# Patient Record
Sex: Female | Born: 1960 | Race: Black or African American | Hispanic: No | Smoking: Never smoker
Health system: Southern US, Community
[De-identification: ages and names within clinical notes are randomized; demographics above are authoritative.]

## PROBLEM LIST (undated history)

## (undated) DIAGNOSIS — B009 Herpesviral infection, unspecified: Secondary | ICD-10-CM

## (undated) HISTORY — DX: Herpesviral infection, unspecified: B00.9

---

## 1998-06-29 HISTORY — PX: ABDOMINAL HYSTERECTOMY: SHX81

## 2006-09-03 ENCOUNTER — Encounter: Admission: RE | Admit: 2006-09-03 | Discharge: 2006-09-03 | Payer: Self-pay | Admitting: Obstetrics and Gynecology

## 2008-03-30 ENCOUNTER — Encounter: Admission: RE | Admit: 2008-03-30 | Discharge: 2008-03-30 | Payer: Self-pay | Admitting: Obstetrics and Gynecology

## 2009-09-16 ENCOUNTER — Encounter: Admission: RE | Admit: 2009-09-16 | Discharge: 2009-09-16 | Payer: Self-pay | Admitting: Obstetrics and Gynecology

## 2009-09-23 ENCOUNTER — Encounter: Admission: RE | Admit: 2009-09-23 | Discharge: 2009-09-23 | Payer: Self-pay | Admitting: Obstetrics and Gynecology

## 2010-07-20 ENCOUNTER — Encounter: Payer: Self-pay | Admitting: Obstetrics and Gynecology

## 2010-09-04 ENCOUNTER — Other Ambulatory Visit: Payer: Self-pay | Admitting: Family Medicine

## 2010-09-04 DIAGNOSIS — R221 Localized swelling, mass and lump, neck: Secondary | ICD-10-CM

## 2010-09-05 ENCOUNTER — Ambulatory Visit
Admission: RE | Admit: 2010-09-05 | Discharge: 2010-09-05 | Disposition: A | Discharge status: Home / Self Care | Payer: BC Managed Care – PPO | Source: Ambulatory Visit | Attending: Family Medicine | Admitting: Family Medicine

## 2010-09-05 DIAGNOSIS — R221 Localized swelling, mass and lump, neck: Secondary | ICD-10-CM

## 2010-09-08 ENCOUNTER — Other Ambulatory Visit: Payer: Self-pay | Admitting: Obstetrics and Gynecology

## 2010-09-08 DIAGNOSIS — Z1231 Encounter for screening mammogram for malignant neoplasm of breast: Secondary | ICD-10-CM

## 2010-09-16 ENCOUNTER — Other Ambulatory Visit: Payer: Self-pay | Admitting: Otolaryngology

## 2010-09-16 ENCOUNTER — Other Ambulatory Visit (HOSPITAL_COMMUNITY)
Admission: RE | Admit: 2010-09-16 | Discharge: 2010-09-16 | Disposition: A | Discharge status: Home / Self Care | Payer: Self-pay | Source: Ambulatory Visit | Attending: Otolaryngology | Admitting: Otolaryngology

## 2010-09-16 DIAGNOSIS — E049 Nontoxic goiter, unspecified: Secondary | ICD-10-CM | POA: Insufficient documentation

## 2010-09-18 ENCOUNTER — Ambulatory Visit: Payer: Self-pay

## 2011-01-22 ENCOUNTER — Ambulatory Visit
Admission: RE | Admit: 2011-01-22 | Discharge: 2011-01-22 | Disposition: A | Discharge status: Home / Self Care | Payer: BC Managed Care – PPO | Source: Ambulatory Visit | Attending: Obstetrics and Gynecology | Admitting: Obstetrics and Gynecology

## 2011-01-22 DIAGNOSIS — Z1231 Encounter for screening mammogram for malignant neoplasm of breast: Secondary | ICD-10-CM

## 2011-09-23 ENCOUNTER — Ambulatory Visit (INDEPENDENT_AMBULATORY_CARE_PROVIDER_SITE_OTHER): Payer: BC Managed Care – PPO | Admitting: Obstetrics and Gynecology

## 2011-09-23 DIAGNOSIS — Z01419 Encounter for gynecological examination (general) (routine) without abnormal findings: Secondary | ICD-10-CM

## 2011-12-14 ENCOUNTER — Other Ambulatory Visit: Payer: Self-pay | Admitting: Obstetrics and Gynecology

## 2011-12-14 DIAGNOSIS — Z1231 Encounter for screening mammogram for malignant neoplasm of breast: Secondary | ICD-10-CM

## 2012-01-25 ENCOUNTER — Ambulatory Visit: Payer: Self-pay

## 2012-02-11 ENCOUNTER — Ambulatory Visit: Payer: Self-pay

## 2012-02-16 ENCOUNTER — Ambulatory Visit
Admission: RE | Admit: 2012-02-16 | Discharge: 2012-02-16 | Disposition: A | Discharge status: Home / Self Care | Payer: BC Managed Care – PPO | Source: Ambulatory Visit | Attending: Obstetrics and Gynecology | Admitting: Obstetrics and Gynecology

## 2012-02-16 DIAGNOSIS — Z1231 Encounter for screening mammogram for malignant neoplasm of breast: Secondary | ICD-10-CM

## 2012-02-17 ENCOUNTER — Encounter: Payer: Self-pay | Admitting: Obstetrics and Gynecology

## 2012-05-14 IMAGING — US US SOFT TISSUE HEAD/NECK
1 series · 14 of 25 positions shown · non-contrast
Comparison: None.

CLINICAL DATA: Right thyroid nodule on exam.

THYROID ULTRASOUND
TECHNIQUE: Ultrasound examination of the thyroid gland and adjacent
soft tissues was performed.

[Series 1: us soft tissue head/neck · 0.10mm/px · 14 of 67 slices shown]
[im 1/67]
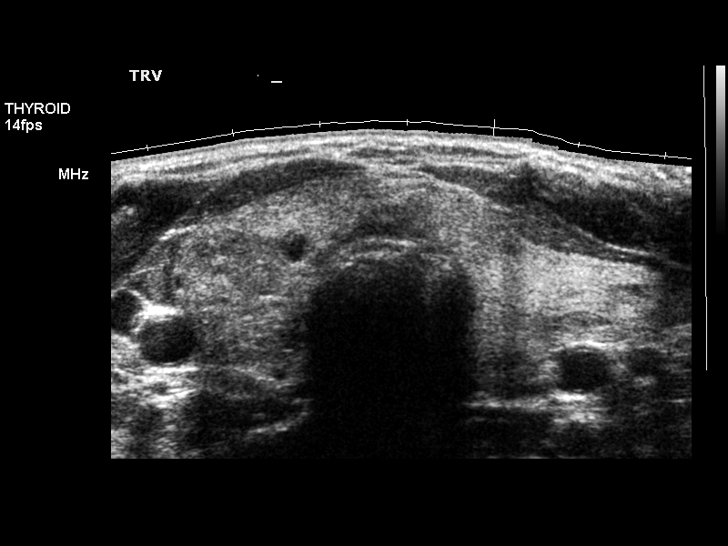
[im 6/67]
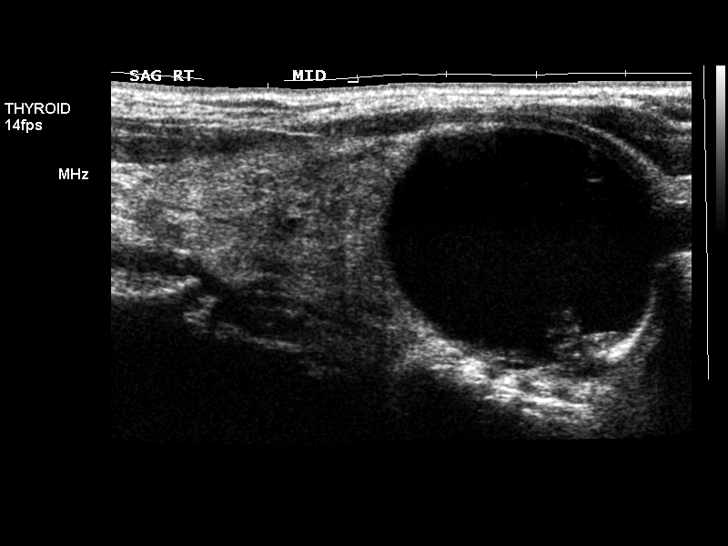
[im 12/67]
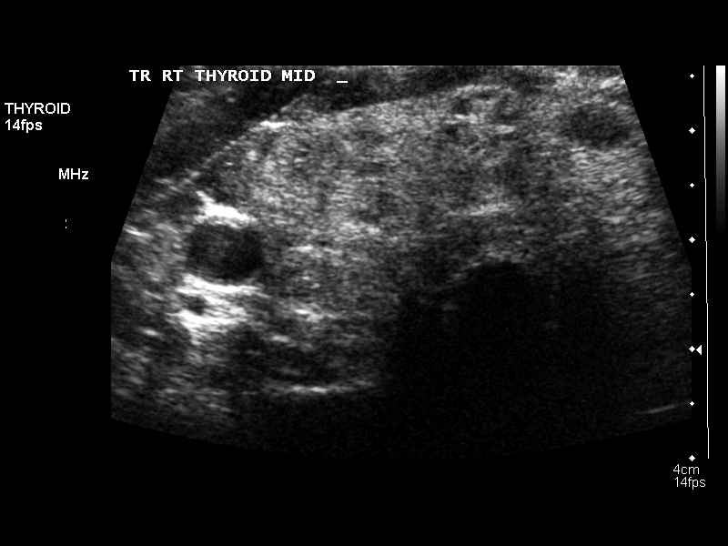
[im 17/67]
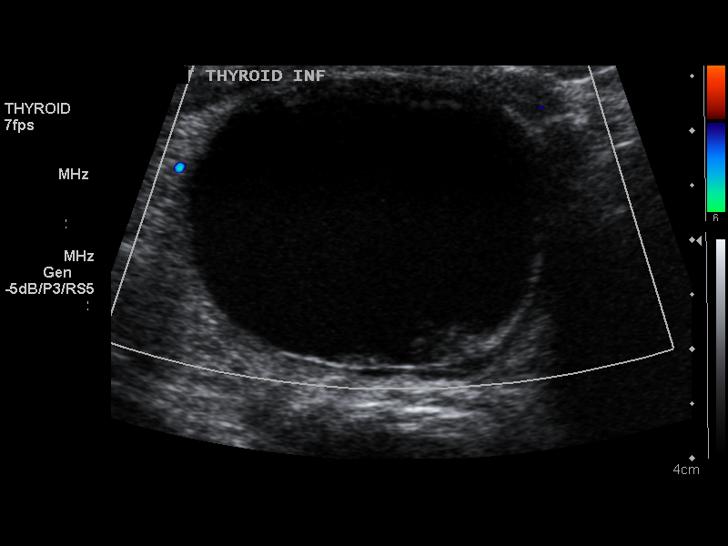
[im 23/67]
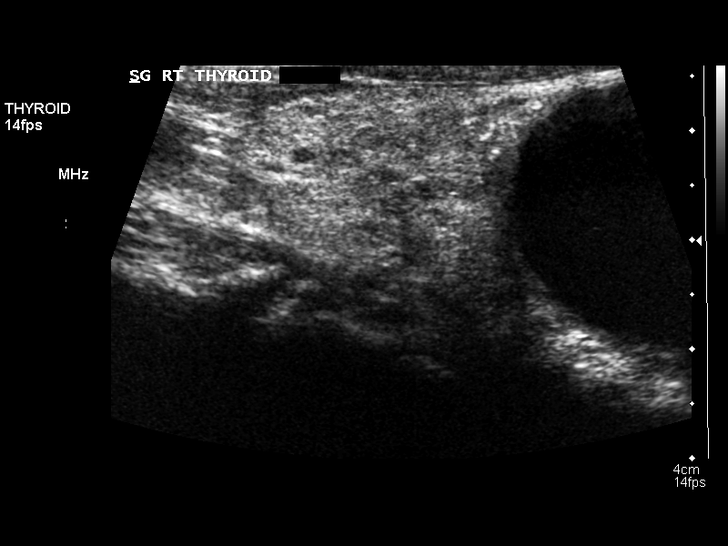
[im 25/67]
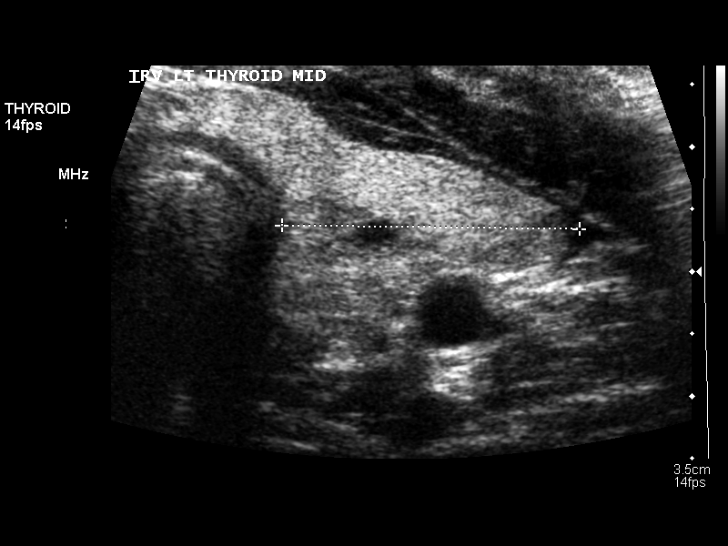
[im 31/67]
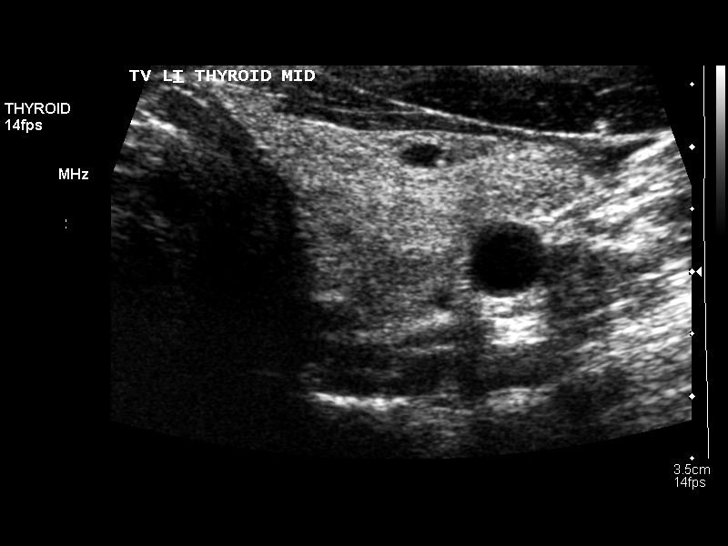
[im 36/67]
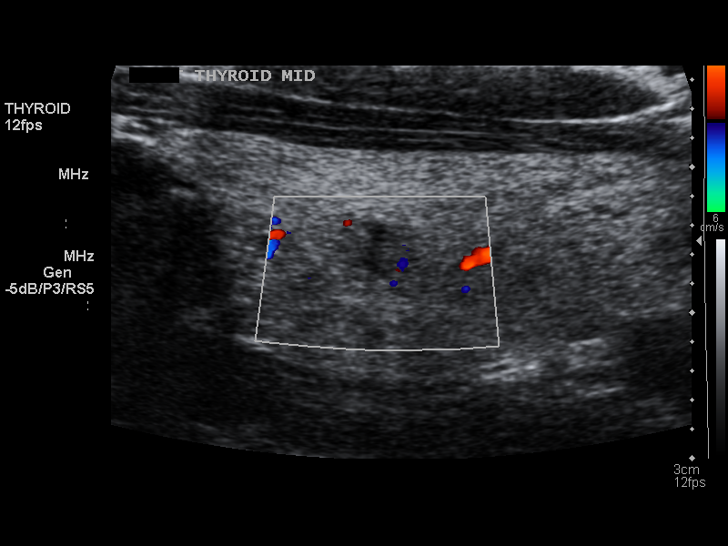
[im 42/67]
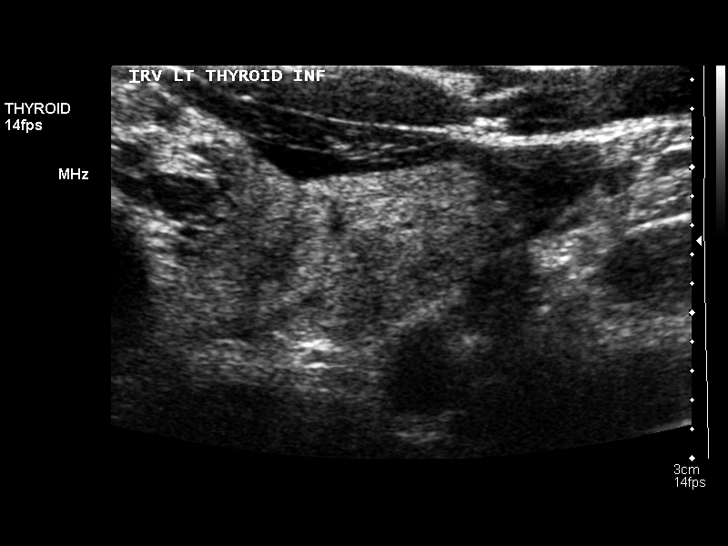
[im 45/67]
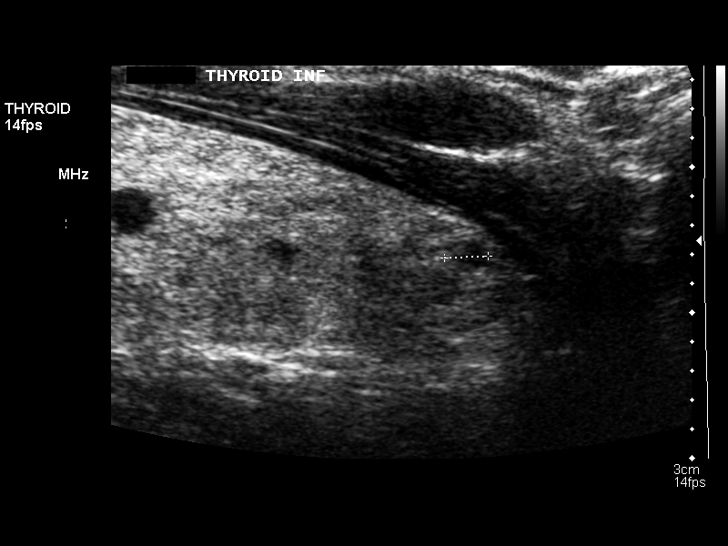
[im 50/67]
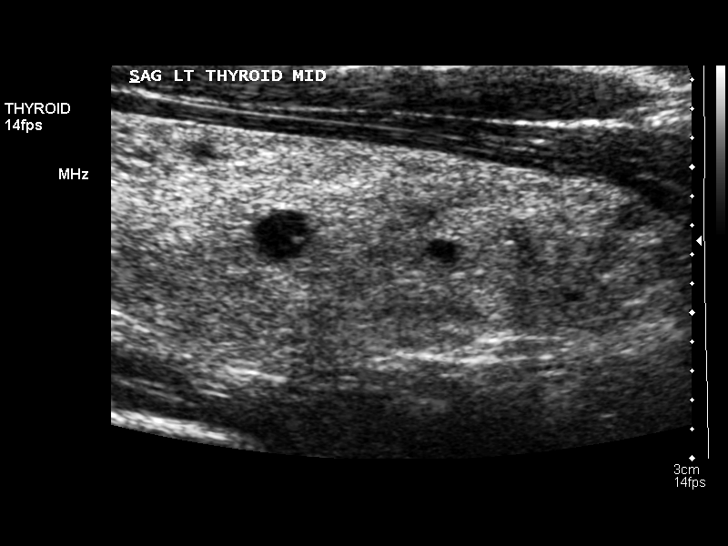
[im 56/67]
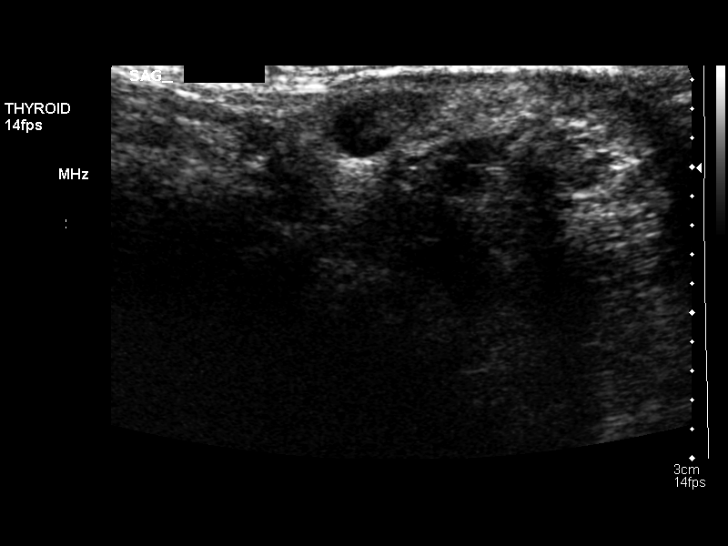
[im 61/67]
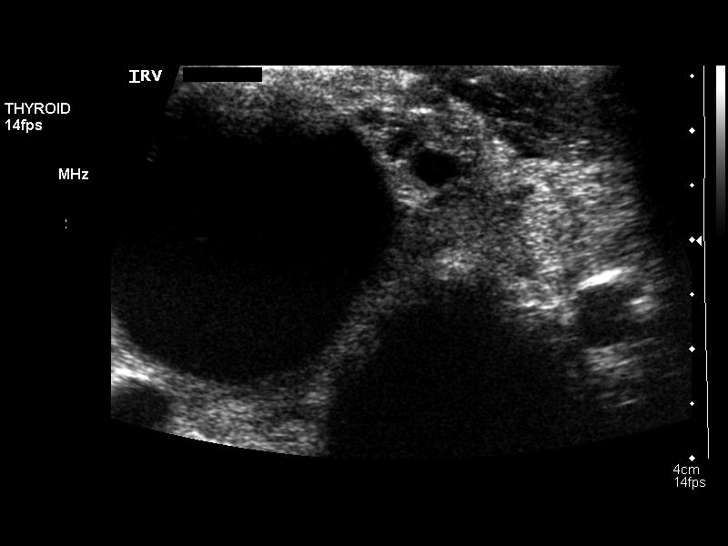
[im 67/67]
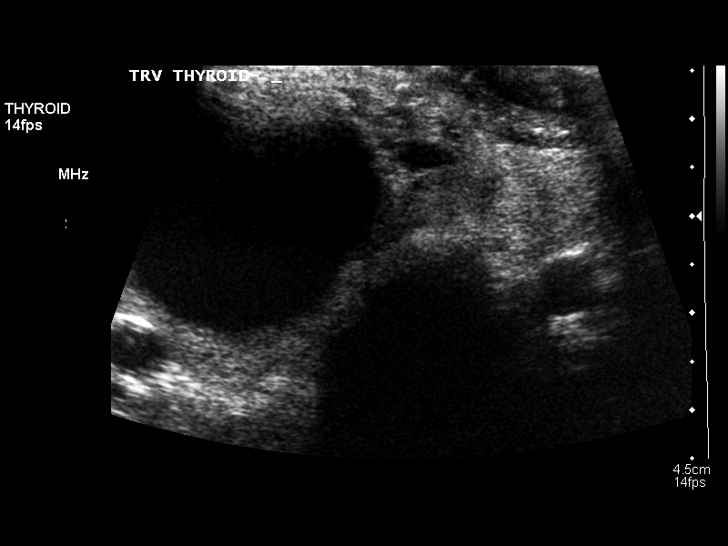

[14 of 25 positions shown; findings below may reference images not displayed]

FINDINGS: Right thyroid lobe:  6.3 x 2.8 x 1.8 cm
Left thyroid lobe:  5.3 x 1.9 x 2.4 cm
Isthmus:  9 mm in thickness.

Focal nodules:  Multiple small mixed cystic and solid nodules are
seen within both thyroid lobes, which measure up to 1.2 cm.

A dominant, nearly completely cystic nodule with several solid
mural nodules is seen in the inferior right thyroid lobe which
measures 3.7 cm.

Lymphadenopathy:  None visualized.
IMPRESSION: Multinodular goiter, with dominant 3.7 cm cystic and solid nodule
in the inferior right lobe.  Ultrasound guided fine needle
aspiration should be considered.

This recommendation follows the consensus statement:  Management of
Thyroid Nodules Detected as US:  Society of [employer]ologists in
800.  Available online at :
[URL]

## 2013-01-05 ENCOUNTER — Other Ambulatory Visit: Payer: Self-pay

## 2013-01-05 DIAGNOSIS — Z1231 Encounter for screening mammogram for malignant neoplasm of breast: Secondary | ICD-10-CM

## 2013-02-16 ENCOUNTER — Ambulatory Visit: Payer: Self-pay

## 2013-02-24 ENCOUNTER — Ambulatory Visit
Admission: RE | Admit: 2013-02-24 | Discharge: 2013-02-24 | Disposition: A | Discharge status: Home / Self Care | Payer: BC Managed Care – PPO | Source: Ambulatory Visit

## 2013-02-24 DIAGNOSIS — Z1231 Encounter for screening mammogram for malignant neoplasm of breast: Secondary | ICD-10-CM

## 2014-01-24 ENCOUNTER — Other Ambulatory Visit: Payer: Self-pay

## 2014-01-24 DIAGNOSIS — Z1231 Encounter for screening mammogram for malignant neoplasm of breast: Secondary | ICD-10-CM

## 2014-02-28 ENCOUNTER — Ambulatory Visit
Admission: RE | Admit: 2014-02-28 | Discharge: 2014-02-28 | Disposition: A | Discharge status: Home / Self Care | Payer: Managed Care, Other (non HMO) | Source: Ambulatory Visit

## 2014-02-28 DIAGNOSIS — Z1231 Encounter for screening mammogram for malignant neoplasm of breast: Secondary | ICD-10-CM

## 2014-12-17 ENCOUNTER — Other Ambulatory Visit: Payer: Self-pay

## 2014-12-17 DIAGNOSIS — Z1231 Encounter for screening mammogram for malignant neoplasm of breast: Secondary | ICD-10-CM

## 2015-01-22 IMAGING — MG MM DIGITAL SCREENING BILATERAL
4 series · 4 of 4 positions shown · non-contrast
Comparison: Previous exam(s).

CLINICAL DATA: Screening.

EXAM:
DIGITAL SCREENING BILATERAL MAMMOGRAM WITH CAD

[R MLO]
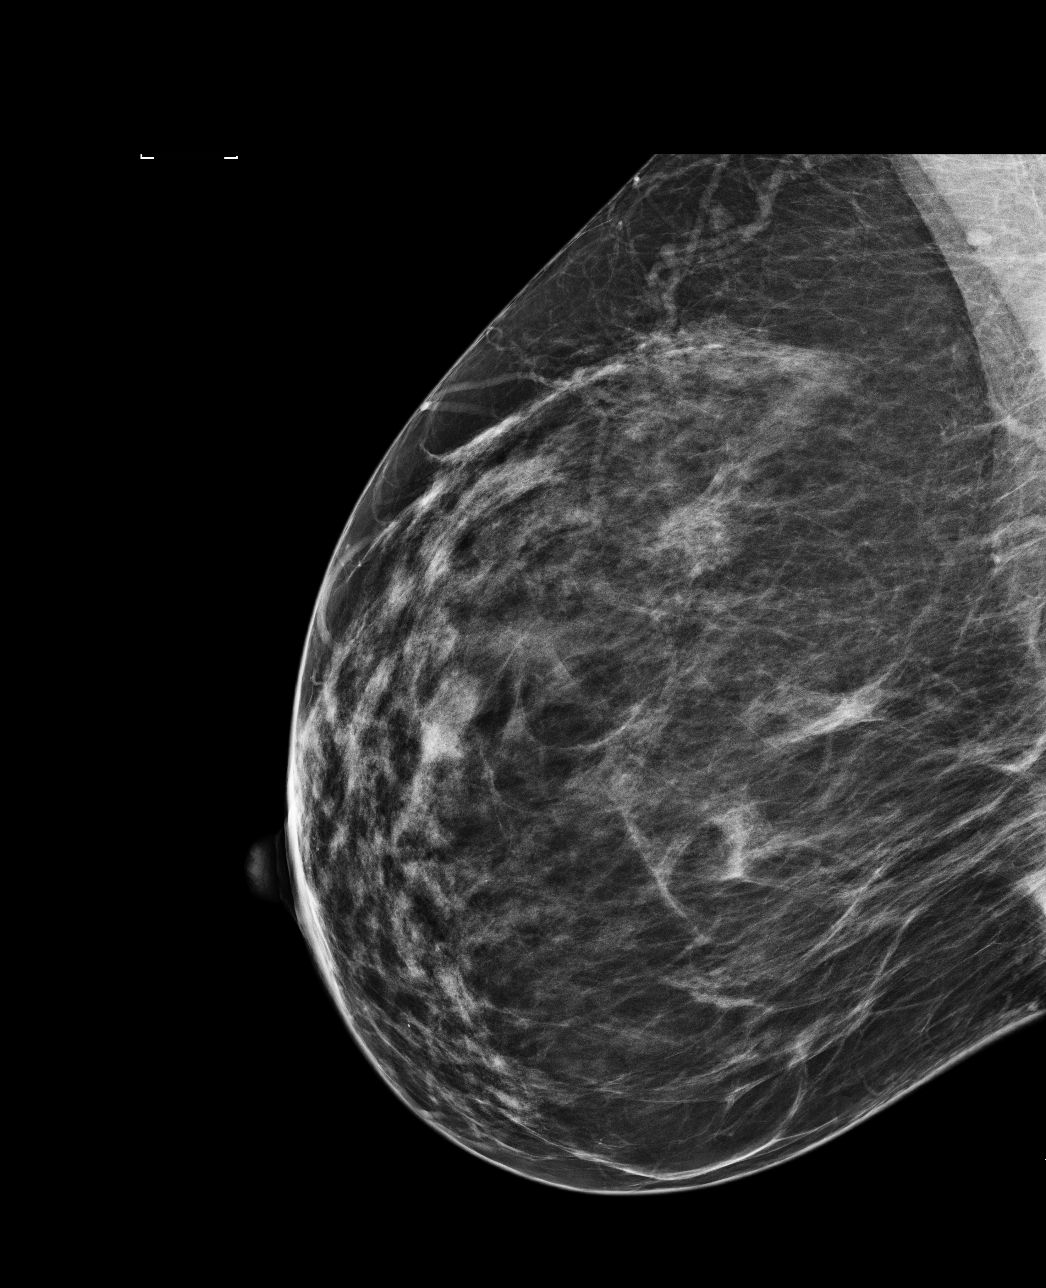

[L CC]
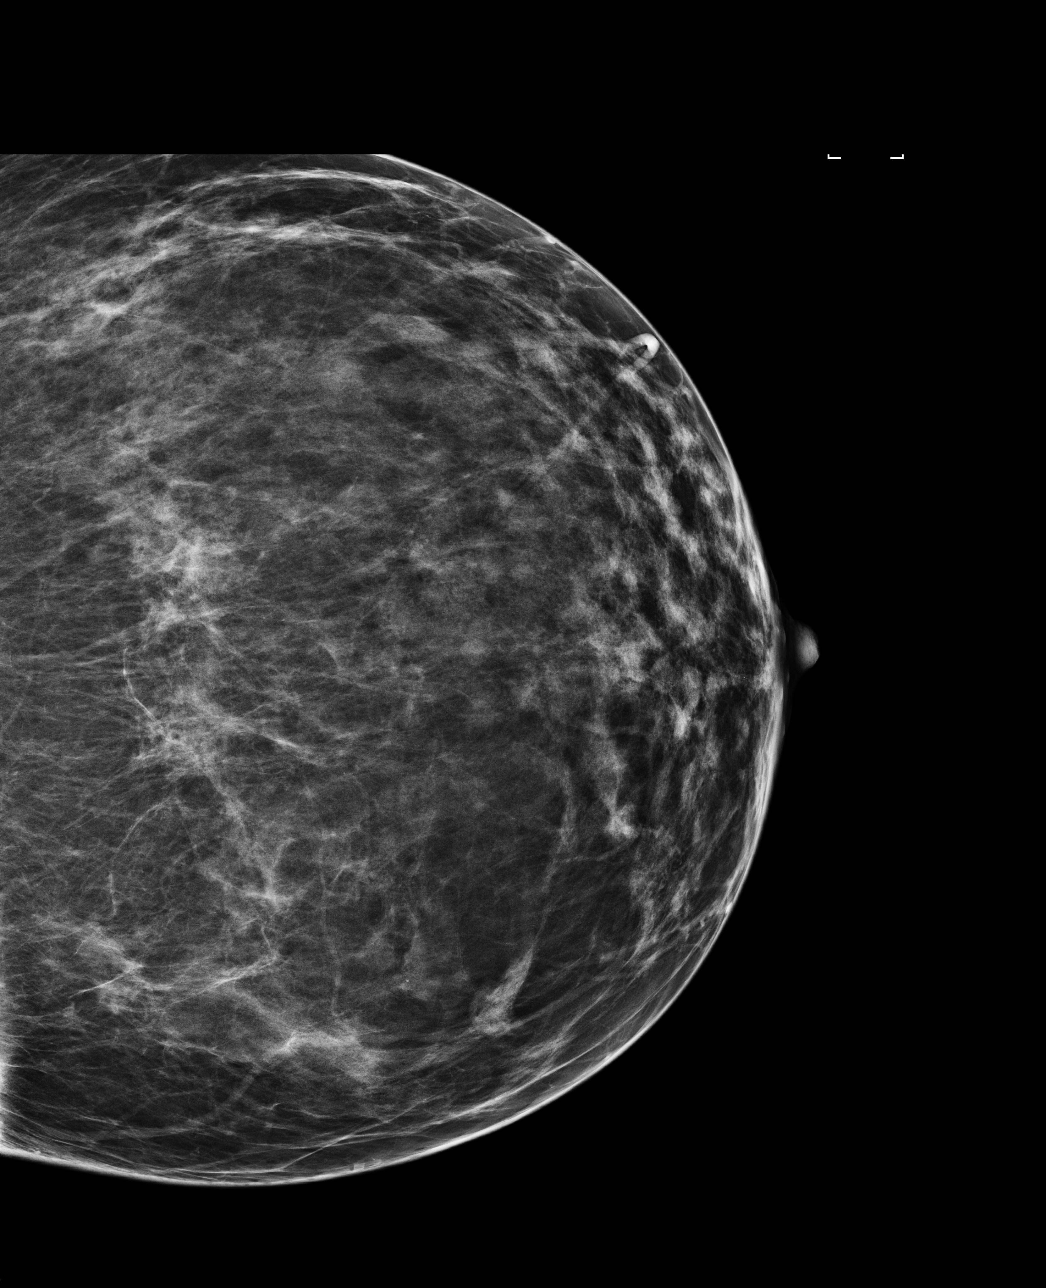

[L MLO]
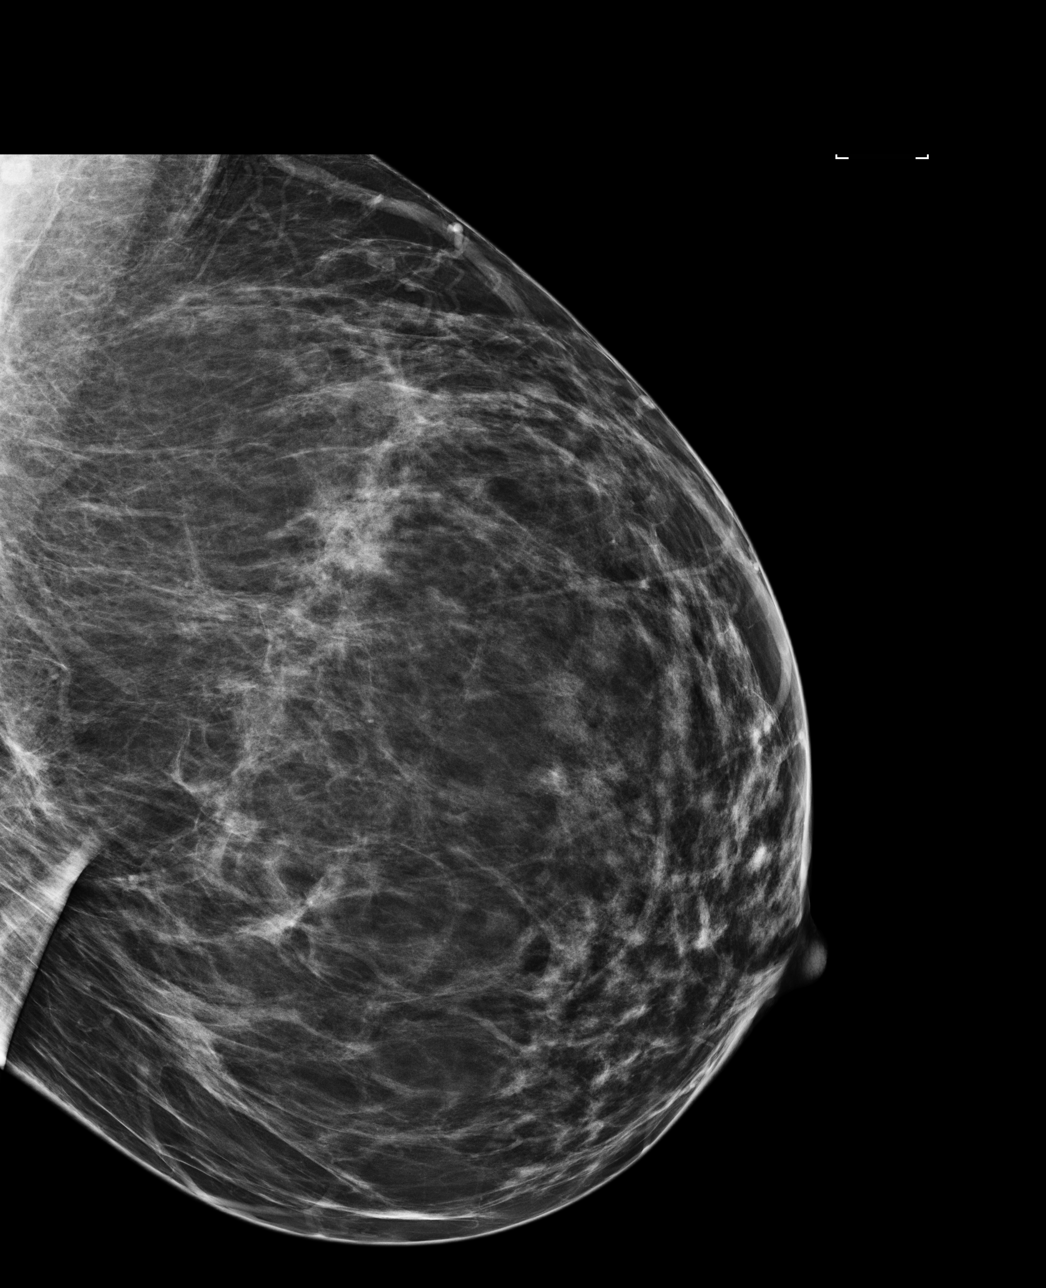

[R CC]
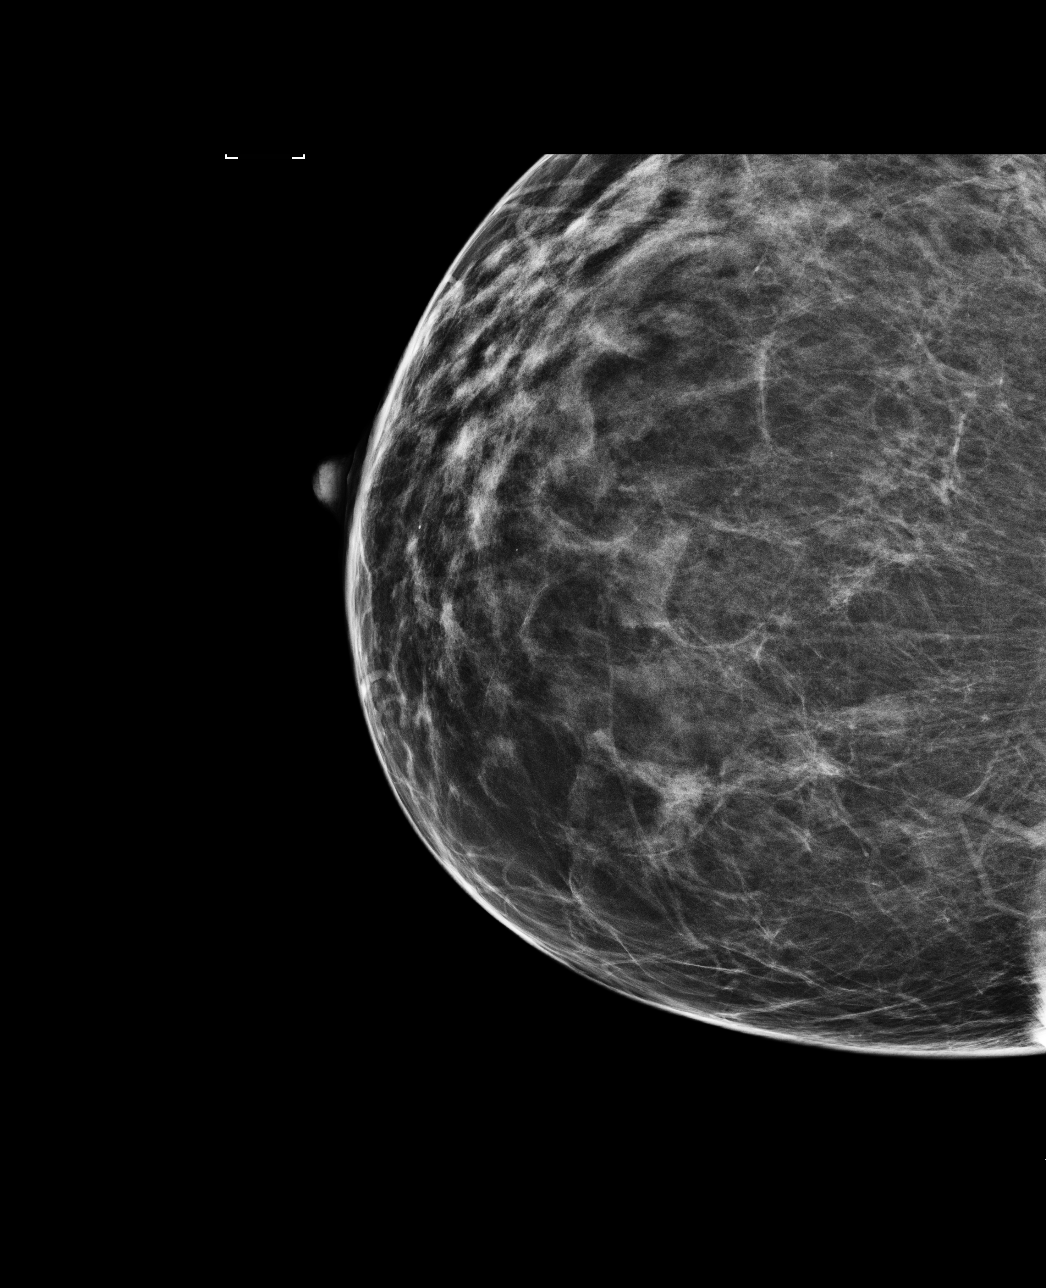

[4 of 4 positions shown; findings below may reference images not displayed]

ACR Breast Density Category c: The breast tissue is heterogeneously
dense, which may obscure small masses.
FINDINGS: There are no findings suspicious for malignancy. Images were
processed with CAD.
IMPRESSION: No mammographic evidence of malignancy. A result letter of this
screening mammogram will be mailed directly to the patient.

RECOMMENDATION:
Screening mammogram in one year. (Code:[id number])

BI-RADS CATEGORY  1: Negative.

## 2015-03-05 ENCOUNTER — Ambulatory Visit: Payer: Self-pay

## 2017-01-11 DIAGNOSIS — Z01419 Encounter for gynecological examination (general) (routine) without abnormal findings: Secondary | ICD-10-CM | POA: Diagnosis not present

## 2017-01-11 DIAGNOSIS — Z1231 Encounter for screening mammogram for malignant neoplasm of breast: Secondary | ICD-10-CM | POA: Diagnosis not present

## 2017-03-29 DIAGNOSIS — E559 Vitamin D deficiency, unspecified: Secondary | ICD-10-CM | POA: Diagnosis not present

## 2017-03-29 DIAGNOSIS — Z131 Encounter for screening for diabetes mellitus: Secondary | ICD-10-CM | POA: Diagnosis not present

## 2017-03-29 DIAGNOSIS — Z1329 Encounter for screening for other suspected endocrine disorder: Secondary | ICD-10-CM | POA: Diagnosis not present

## 2017-03-29 DIAGNOSIS — Z0000 Encounter for general adult medical examination without abnormal findings: Secondary | ICD-10-CM | POA: Diagnosis not present

## 2017-03-29 DIAGNOSIS — Z136 Encounter for screening for cardiovascular disorders: Secondary | ICD-10-CM | POA: Diagnosis not present

## 2017-04-05 ENCOUNTER — Encounter: Payer: 59 | Attending: Internal Medicine | Admitting: Registered"

## 2017-04-05 ENCOUNTER — Encounter: Payer: Self-pay | Admitting: Registered"

## 2017-04-05 DIAGNOSIS — E669 Obesity, unspecified: Secondary | ICD-10-CM | POA: Insufficient documentation

## 2017-04-05 DIAGNOSIS — E785 Hyperlipidemia, unspecified: Secondary | ICD-10-CM | POA: Diagnosis not present

## 2017-04-05 DIAGNOSIS — R635 Abnormal weight gain: Secondary | ICD-10-CM | POA: Insufficient documentation

## 2017-04-05 DIAGNOSIS — Z713 Dietary counseling and surveillance: Secondary | ICD-10-CM | POA: Diagnosis not present

## 2017-04-05 NOTE — Progress Notes (Signed)
Medical Nutrition Therapy:  Appt start time: 0800 end time:  0840.  Assessment:  Primary concerns today: Pt states she has gained over 30 lbs in the last 18 months even though she eats 1,000 cal or less and exercises 3-5x week. Pt sates she is not hungry most of the time. Pt states she has swelling in her hands and feet. Pt states she does not use salt at home just because she does not enjoy salty food. Pt states she has been eating out more since her daughter left for college Aug 2017 ([place] and [place]).   Pt states she was having frequent surgeries for fibroids and had partial hysterectomy in 2000. Per referral TSH is in normal range (1.92 UIU/mL)  Pt states she is taking potassium OTC occasionally for cramps.  Preferred Learning Style:   No preference indicated   Learning Readiness:   Contemplating  MEDICATIONS: reviewed (just started 30-day phentermine for metabolism boost)   DIETARY INTAKE:  Usual eating pattern includes 2-3 meals and 0-2 snacks per day.  Everyday foods include salad.  Avoided foods include (doesn't enjoy beans).    24-hr recall:  B ( AM): chicken breakfast bowl [employer] Snk ( AM): bananas (for potassium), berries, grapes  L ( PM): Salad, cob salad OR Zaxbys salad Snk ( PM): none or fruit D ( PM): none if big salad at lunch OR steel head trout, veggie, sweet potato  Snk ( PM): avocado Beverages: water  Usual physical activity: 3-5x week cardio  Estimated energy needs: 1800 calories 200 g carbohydrates 113 g protein 60 g fat  Nutritional Diagnosis:  NB-1.1 Food and nutrition-related knowledge deficit As related to sources of sodium.  As evidenced by stated low intake of sodium not consistent with dietary recall (fast food) .    Intervention:  Nutrition Education. Discussed need for adequate caloric intake to prevent reduced metabolism. Discussed role of sodium in water weight gain and food sources. Discussed need for iodine in diet when  avoiding sodium. Discussed food sources of potassium. Discussed resistance training for building muscle and increasing metabolism.  Teaching Method Utilized:  Visual Auditory  Handouts given during visit include: none  Barriers to learning/adherence to lifestyle change: resistance to increasing food intake due to fear of gaining weight  Demonstrated degree of understanding via:  Teach Back   Monitoring/Evaluation:  Dietary intake, exercise, and body weight prn.

## 2017-04-05 NOTE — Patient Instructions (Signed)
   Look for food rich iodine since you do not eat much salt  Continue taking vitamin D  Aim to eat more foods from home to get less sodium in your diet  Avoid skipping meals  If you are still experiencing weight gain after eating more calories and less salt, you may want to investigate hormone levels  Potassium food sources: tomatoes, potatoes, swiss chard & spinach

## 2017-07-01 DIAGNOSIS — M6740 Ganglion, unspecified site: Secondary | ICD-10-CM | POA: Diagnosis not present

## 2017-08-17 DIAGNOSIS — Z23 Encounter for immunization: Secondary | ICD-10-CM | POA: Diagnosis not present

## 2018-01-06 DIAGNOSIS — K219 Gastro-esophageal reflux disease without esophagitis: Secondary | ICD-10-CM | POA: Diagnosis not present

## 2018-01-06 DIAGNOSIS — J3501 Chronic tonsillitis: Secondary | ICD-10-CM | POA: Diagnosis not present

## 2018-01-06 DIAGNOSIS — R196 Halitosis: Secondary | ICD-10-CM | POA: Diagnosis not present

## 2018-01-19 DIAGNOSIS — Z01419 Encounter for gynecological examination (general) (routine) without abnormal findings: Secondary | ICD-10-CM | POA: Diagnosis not present

## 2018-01-19 DIAGNOSIS — Z6831 Body mass index (BMI) 31.0-31.9, adult: Secondary | ICD-10-CM | POA: Diagnosis not present

## 2018-01-19 DIAGNOSIS — Z1231 Encounter for screening mammogram for malignant neoplasm of breast: Secondary | ICD-10-CM | POA: Diagnosis not present

## 2018-03-08 DIAGNOSIS — L649 Androgenic alopecia, unspecified: Secondary | ICD-10-CM | POA: Diagnosis not present

## 2018-03-08 DIAGNOSIS — L669 Cicatricial alopecia, unspecified: Secondary | ICD-10-CM | POA: Diagnosis not present

## 2018-03-08 DIAGNOSIS — L659 Nonscarring hair loss, unspecified: Secondary | ICD-10-CM | POA: Diagnosis not present

## 2018-04-25 DIAGNOSIS — J321 Chronic frontal sinusitis: Secondary | ICD-10-CM | POA: Diagnosis not present

## 2018-04-25 DIAGNOSIS — H6092 Unspecified otitis externa, left ear: Secondary | ICD-10-CM | POA: Diagnosis not present

## 2018-04-28 DIAGNOSIS — R072 Precordial pain: Secondary | ICD-10-CM | POA: Diagnosis not present

## 2018-04-28 DIAGNOSIS — I499 Cardiac arrhythmia, unspecified: Secondary | ICD-10-CM | POA: Diagnosis not present

## 2018-04-28 DIAGNOSIS — R079 Chest pain, unspecified: Secondary | ICD-10-CM | POA: Diagnosis not present

## 2018-04-29 DIAGNOSIS — R05 Cough: Secondary | ICD-10-CM | POA: Diagnosis not present

## 2018-04-29 DIAGNOSIS — R0789 Other chest pain: Secondary | ICD-10-CM | POA: Diagnosis not present

## 2018-05-11 DIAGNOSIS — R0789 Other chest pain: Secondary | ICD-10-CM | POA: Diagnosis not present

## 2018-05-11 DIAGNOSIS — Z0189 Encounter for other specified special examinations: Secondary | ICD-10-CM | POA: Diagnosis not present

## 2018-06-15 DIAGNOSIS — R196 Halitosis: Secondary | ICD-10-CM | POA: Diagnosis not present

## 2018-06-20 DIAGNOSIS — G44209 Tension-type headache, unspecified, not intractable: Secondary | ICD-10-CM | POA: Diagnosis not present

## 2018-06-20 DIAGNOSIS — R05 Cough: Secondary | ICD-10-CM | POA: Diagnosis not present

## 2018-07-19 ENCOUNTER — Other Ambulatory Visit: Payer: Self-pay | Admitting: Cardiology

## 2018-07-19 DIAGNOSIS — R0789 Other chest pain: Secondary | ICD-10-CM

## 2018-08-15 ENCOUNTER — Ambulatory Visit: Payer: 59

## 2018-08-15 DIAGNOSIS — R0789 Other chest pain: Secondary | ICD-10-CM

## 2018-09-12 DIAGNOSIS — J069 Acute upper respiratory infection, unspecified: Secondary | ICD-10-CM | POA: Diagnosis not present

## 2020-05-03 ENCOUNTER — Encounter: Payer: Self-pay | Admitting: Nurse Practitioner

## 2020-05-06 ENCOUNTER — Ambulatory Visit (INDEPENDENT_AMBULATORY_CARE_PROVIDER_SITE_OTHER): Payer: 59

## 2020-05-06 ENCOUNTER — Ambulatory Visit (INDEPENDENT_AMBULATORY_CARE_PROVIDER_SITE_OTHER): Payer: 59 | Admitting: Podiatry

## 2020-05-06 ENCOUNTER — Other Ambulatory Visit: Payer: Self-pay

## 2020-05-06 DIAGNOSIS — M21611 Bunion of right foot: Secondary | ICD-10-CM | POA: Diagnosis not present

## 2020-05-06 DIAGNOSIS — M2041 Other hammer toe(s) (acquired), right foot: Secondary | ICD-10-CM | POA: Diagnosis not present

## 2020-05-06 NOTE — Progress Notes (Signed)
   Subjective: 59 y.o. female presenting today as a new patient for evaluation of a bunion and hammertoe deformity to the second digit of the right foot.  Patient states it is not painful however she was referred here by a local podiatrist, Dr. Elijah Birk, for possible surgical consultation.  She states that she has a history of toe fracture to the right second toe and it is elevated crossing over the hallux.  She presents for further treatment evaluation  No past medical history on file.    Objective: Physical Exam General: The patient is alert and oriented x3 in no acute distress.  Dermatology: Skin is cool, dry and supple bilateral lower extremities. Negative for open lesions or macerations.  Vascular: Palpable pedal pulses bilaterally. No edema or erythema noted. Capillary refill within normal limits.  Neurological: Epicritic and protective threshold grossly intact bilaterally.   Musculoskeletal Exam: Clinical evidence of bunion deformity noted to the respective foot. There is negative pain on palpation range of motion of the first MPJ. Lateral deviation of the hallux noted consistent with hallux abductovalgus. Hammertoe contracture also noted on clinical exam to digits #2 of the right foot.  There is no pain or symptoms on palpation and range of motion also noted to the metatarsal phalangeal joints of the respective hammertoe digits.    Radiographic Exam: Increased intermetatarsal angle greater than 15 with a hallux abductus angle greater than 30 noted on AP view. Moderate degenerative changes noted within the first MPJ. Contracture deformity also noted to the interphalangeal joints and MPJs of the digits of the respective hammertoes.    Assessment: 1. HAV w/ bunion deformity right-asymptomatic 2. Hammertoe deformity second digit right-asymptomatic   Plan of Care:  1. Patient was evaluated. X-Rays reviewed. 2.  At the moment I am going to recommend conservative treatment.  Recommend  good supportive shoes that are wide in the toebox and do not constrict the toes. 3.  Today we did discuss advantages and disadvantages of surgery.  I believe that the disadvantages at the moment outweigh the advantages.  The patient is currently not having any symptoms associated to the foot in regards to pain or limited function 4.  Return to clinic as needed    Felecia Shelling, DPM [facility]  Dr. Felecia Shelling, DPM    [address] [place], [place] [address]                Office [phone]  Fax [phone]

## 2020-05-15 ENCOUNTER — Encounter: Payer: Self-pay | Admitting: Nurse Practitioner

## 2020-05-15 ENCOUNTER — Ambulatory Visit (INDEPENDENT_AMBULATORY_CARE_PROVIDER_SITE_OTHER): Payer: 59 | Admitting: Nurse Practitioner

## 2020-05-15 VITALS — BP 144/80 | HR 92 | Ht 61.75 in | Wt 187.0 lb

## 2020-05-15 DIAGNOSIS — K5904 Chronic idiopathic constipation: Secondary | ICD-10-CM | POA: Diagnosis not present

## 2020-05-15 DIAGNOSIS — K625 Hemorrhage of anus and rectum: Secondary | ICD-10-CM | POA: Diagnosis not present

## 2020-05-15 DIAGNOSIS — K5909 Other constipation: Secondary | ICD-10-CM | POA: Diagnosis not present

## 2020-05-15 MED ORDER — SUTAB 1479-225-188 MG PO TABS: 1.0000 | ORAL_TABLET | ORAL | 0 refills | Status: DC

## 2020-05-15 MED ORDER — SUPREP BOWEL PREP KIT 17.5-3.13-1.6 GM/177ML PO SOLN: 1.0000 | ORAL | 0 refills | Status: DC

## 2020-05-15 NOTE — Progress Notes (Signed)
05/15/2020 Melissa Woods [id number] [birth year]   CHIEF COMPLAINT:  Rectal  Bleeding   HISTORY OF PRESENT ILLNESS: Melissa Woods is a 59 year old female with a past medical history of chronic constipation.  Past surgical history includes a partial hysterectomy secondary to uterine fibroids.  She presents to our office today as referred by her gynecologist Dr. Caffie Damme for further evaluation regarding rectal bleeding.  She has chronic constipation for which she typically takes olive oil 1 tablespoon as needed.  If no BM is produced she then takes Gas-X which results in passing a normal formed to soft bowel movement.  She used MiraLAX in the past which was ineffective and made her feel gassy.  She took the formula girl detox tea mid-October 2021 which resulted in passing a large amount of soft then looser stools.  She saw a small amount of bright red blood on the toilet tissue on 1 occasion and she was concerned.  No associated rectal or abdominal pain.  She was evaluated by her gynecologist 04/22/2020 she was assessed to have a external hemorrhoid and she was prescribed topical Anusol cream she has not needed.  He underwent a colonoscopy at least 7 years ago, possibly by [facility] which she stated was normal.  No known family history of colon cancer.  She underwent a colonic seizure April and July 2021 in [place].  She typically has abdominal bloat for 1 day following the colonic and passes a normal bowel movement about 2 days later.  No other complaints at this time.   Past Medical History:  Diagnosis Date   Herpes simplex type 1 infection    Past Surgical History:  Procedure Laterality Date   ABDOMINAL HYSTERECTOMY  2000    Social History: She is single.  She has 1 daughter and 1 son.  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  Non-smoker.  No alcohol.  No drug use.  Family History: Mother age 71 healthy. Father's history unknown.   Allergies  Allergen  Reactions   Reglan [Metoclopramide] Other (See Comments) and Nausea And Vomiting    Stroke like symptoms Stroke like symptoms       Outpatient Encounter Medications as of 05/15/2020  Medication Sig   Ascorbic Acid (VITAMIN C) 1000 MG tablet Take 1,000 mg by mouth daily.   simethicone (MYLICON) 657 MG chewable tablet Chew 125 mg by mouth as needed for flatulence.   vitamin B-12 (CYANOCOBALAMIN) 1000 MCG tablet Take 1,000 mcg by mouth daily.   VITAMIN D, ERGOCALCIFEROL, PO Take 5,000 Units by mouth 1 day or 1 dose.    Zinc 50 MG TABS Take 1 tablet by mouth daily.   SUPREP BOWEL PREP KIT 17.5-3.13-1.6 GM/177ML SOLN Take 1 kit by mouth as directed. For colonoscopy prep BIN: [id number] PCN: CN GROUP: [id number] ID: [id number]   [DISCONTINUED] amoxicillin (AMOXIL) 875 MG tablet amoxicillin 875 mg tablet   [DISCONTINUED] APPLE CIDER VINEGAR PO Take by mouth.   [DISCONTINUED] azithromycin (ZITHROMAX) 250 MG tablet azithromycin 250 mg tablet  take 2 tablets by mouth on day 1 then 1 tablet on days 2 through 5   [DISCONTINUED] benzonatate (TESSALON) 100 MG capsule benzonatate 100 mg capsule   [DISCONTINUED] bisacodyl (DULCOLAX) 5 MG EC tablet bisacodyl 5 mg tablet,delayed release  take as directed   [DISCONTINUED] finasteride (PROSCAR) 5 MG tablet Take 2.5 mg daily, 1/2 tablet   [DISCONTINUED] fluconazole (DIFLUCAN) 150 MG tablet fluconazole 150 mg tablet  take 1 tablet by  mouth as a single dose   [DISCONTINUED] hydrocortisone (ANUSOL-HC) 2.5 % rectal cream SMARTSIG:Sparingly Topical 2-4 Times Daily   [DISCONTINUED] ipratropium (ATROVENT HFA) 17 MCG/ACT inhaler Atrovent HFA 17 mcg/actuation aerosol inhaler   [DISCONTINUED] meloxicam (MOBIC) 7.5 MG tablet Take 7.5 mg by mouth daily.   [DISCONTINUED] metroNIDAZOLE (FLAGYL) 500 MG tablet metronidazole 500 mg tablet  take 1 tablet by mouth twice a day for 7 days   [DISCONTINUED] pantoprazole (PROTONIX) 40 MG tablet One pill(16m) 30  minutes before breakfast and one pill 30 minutes before dinner.   [DISCONTINUED] phentermine (ADIPEX-P) 37.5 MG tablet Take 37.5 mg by mouth daily before breakfast.   [DISCONTINUED] polyethylene glycol-electrolytes (GAVILYTE-N WITH FLAVOR PACK) 420 g solution GaviLyte-N 420 gram oral solution  take by mouth as directed   [DISCONTINUED] POTASSIUM BICARBONATE PO Take by mouth.   [DISCONTINUED] SUPREP BOWEL PREP KIT 17.5-3.13-1.6 GM/177ML SOLN Take 1 kit by mouth as directed. For colonoscopy prep BIN: [id number]: CN GROUP: [id number]:: [id number]  No facility-administered encounter medications on file as of 05/15/2020.     REVIEW OF SYSTEMS:  Gen: Denies fever, sweats or chills. No weight loss.  CV: Denies chest pain, palpitations or edema. Resp: Denies cough, shortness of breath of hemoptysis.  GI: See HPI. GU : Denies urinary burning, blood in urine, increased urinary frequency or incontinence. MS: Denies joint pain, muscles aches or weakness. Derm: Denies rash, itchiness, skin lesions or unhealing ulcers. Psych: Denies depression, anxiety or memory loss. Heme: Denies bruising, bleeding. Neuro:  Denies headaches, dizziness or paresthesias. Endo:  Denies any problems with DM, thyroid or adrenal function.    PHYSICAL EXAM: BP (!) 144/80 (BP Location: Left Arm, Patient Position: Sitting, Cuff Size: Normal)    Pulse 92    Ht 5' 1.75" (1.568 m) Comment: height measured without shoes   Wt 187 lb (84.8 kg) Comment: pt refused   BMI 34.48 kg/m  General: Well developed 59year old female in no acute distress. Head: Normocephalic and atraumatic. Eyes:  Sclerae non-icteric, conjunctive pink. Ears: Normal auditory acuity. Mouth: Dentition intact. No ulcers or lesions.  Neck: Supple, no lymphadenopathy or thyromegaly.  Lungs: Clear bilaterally to auscultation without wheezes, crackles or rhonchi. Heart: Regular rate and rhythm. No murmur, rub or gallop appreciated.  Abdomen: Soft,  nontender, non distended. No masses. No hepatosplenomegaly. Normoactive bowel sounds x 4 quadrants.  Rectal: Patient declined exam as she is no longer having rectal bleeding, she elects to proceed with a colonoscopy for further evaluation. Musculoskeletal: Symmetrical with no gross deformities. Skin: Warm and dry. No rash or lesions on visible extremities. Extremities: No edema. Neurological: Alert oriented x 4, no focal deficits.  Psychological:  Alert and cooperative. Normal mood and affect.  ASSESSMENT AND PLAN:  149  59year old female with chronic constipation with one episode of bright red blood on the toilet tissue.  She reports having  a normal colonoscopy 7 years ago. -Due to her persistent constipation with an episode of rectal bleeding with a past colonoscopy more than 5 years ago,  I have recommended a colonoscopy to  rule out any other cause of bleeding above the site of hemorrhoids such as a polyp or cancerous process. -Colonoscopy benefits and risks discussed including risk with sedation, risk of bleeding, perforation and infection  -She will call her office if rectal bleeding recurs -Check CBC if rectal bleeding recurs -I discussed a trial with Linzess, she will call our office if she wishes to try medication -Further recommendations to be determined  after colonoscopy completed -I advised against any further colonic therapy treatment          CC:  Antony Blackbird, MD

## 2020-05-15 NOTE — Progress Notes (Signed)
Suprep was sent to pt pharmacy of choice. While reviewing prep instructions, pt requested to change prep to Sutab. Detailed instructions have been given to the pt for both preps. In light of the possibility for for pt to confuse instructions with the type of prep she picks up from pharmacy, will plan to follow up with pt to confirm the type of prep she picked up AND that she understands the instructions for her prep. Notes of colonoscopy appt has been updated to reflect this concern/issue.

## 2020-05-15 NOTE — Patient Instructions (Addendum)
COLONOSCOPY: You have been scheduled for a colonoscopy. Please follow written instructions given to you at your visit today.   PREP: You have requested to change from SUPREP to SUTAB. BOTH prescriptions have been sent to your pharmacy. Please discuss billing concerns with the pharmacy regarding these prescriptions.   PLEASE ENSURE YOU NOTIFY DR. Barron Alvine ABOUT THE PREP YOU CHOSE TO USE FOR YOUR COLONOSCOPY.   YOU HAVE BEEN GIVEN DETAILED INSTRUCTIONS REGARDING BOTH SUTAB AND SUPREP. ENSURE YOU ARE FOLLOWING THE INSTRUCTIONS THAT PERTAIN TO THE PREP YOU PURCHASED.  Please pick up your prep supplies at the pharmacy within the next 1-3 days.  INHALERS: If you use inhalers (even only as needed), please bring them with you on the day of your procedure.  HEALTHCARE LAWS AND MY CHART RESULTS: Due to recent changes in healthcare laws, you may see the results of your imaging and laboratory studies on MyChart before your provider has had a chance to review them.  We understand that in some cases there may be results that are confusing or concerning to you. Not all laboratory results come back in the same time frame and the provider may be waiting for multiple results in order to interpret others.  Please give [place] 48 hours in order for your provider to thoroughly review all the results before contacting the office for clarification of your results.   Call the office at [phone] if your rectal bleeding returns.  If you are age 58 or younger, your body mass index should be between 19-25. Your There is no height or weight on file to calculate BMI. If this is out of the aformentioned range listed, please consider follow up with your Primary Care Provider.   Thank you for trusting me with your gastrointestinal care!    Alcide Evener, NP

## 2020-05-17 NOTE — Progress Notes (Signed)
Agree with the assessment and plan as outlined by Colleen Kennedy-Smith, NP.   Alethea Terhaar, DO, FACG [facility]   

## 2020-06-17 ENCOUNTER — Encounter: Payer: 59 | Admitting: Gastroenterology

## 2020-08-13 LAB — COLOGUARD: COLOGUARD: NEGATIVE

## 2022-08-04 ENCOUNTER — Encounter (HOSPITAL_COMMUNITY): Payer: Self-pay

## 2022-08-04 ENCOUNTER — Ambulatory Visit (HOSPITAL_COMMUNITY)
Admission: EM | Admit: 2022-08-04 | Discharge: 2022-08-04 | Disposition: A | Discharge status: Home / Self Care | Payer: Managed Care, Other (non HMO) | Attending: Family Medicine | Admitting: Family Medicine

## 2022-08-04 DIAGNOSIS — R202 Paresthesia of skin: Secondary | ICD-10-CM | POA: Diagnosis not present

## 2022-08-04 NOTE — ED Triage Notes (Signed)
Pt states that she got a pizza from [employer]. The pizza driver gave her a extra sauce. Her hand started to tingle. She washed her hands with peroxide and dial soap. She said that her hands are still tingly.

## 2022-08-05 NOTE — ED Provider Notes (Signed)
   [place]   [id number] 08/04/22 Arrival Time: 1945  ASSESSMENT & PLAN:  1. Tingling sensation   Localized to R hand; not worse since beginning. Suspect related to hypersensitivity. Prefers moisturizing lotion OTC. Declines Rx topical steroid. Normal breathing/speaking. May f/u as needed.  Reviewed expectations re: course of current medical issues. Questions answered. Outlined signs and symptoms indicating need for more acute intervention. Patient verbalized understanding. After Visit Summary given.   SUBJECTIVE: History from: patient. Lacie Landry is a 62 y.o. female who presents for evaluation of a possible allergic reaction. Pt states that she got a pizza from [employer]. The pizza driver gave her a extra sauce. Her hand started to tingle. She washed her hands with peroxide and dial soap. She said that her hands are still tingly.  No swallowing/resp difficulties.   OBJECTIVE:  Vitals:   08/04/22 2028  BP: (!) 151/88  Pulse: 71  Resp: 14  Temp: 97.7 F (36.5 C)  TempSrc: Oral  SpO2: 98%    General appearance: alert; no distress Eyes: PERRLA; EOMI; conjunctiva normal HENT: normocephalic; atraumatic Neck: supple  Lungs: unlabored Extremities: no cyanosis or edema; symmetrical with no gross deformities Skin: warm and dry; no rashes Neurologic: normal gait; normal symmetric reflexes Psychological: alert and cooperative; normal mood and affect  Labs: No results found for this or any previous visit. Labs Reviewed - No data to display  Imaging: No results found.  Allergies  Allergen Reactions   Reglan [Metoclopramide] Other (See Comments) and Nausea And Vomiting    Stroke like symptoms Stroke like symptoms     Past Medical History:  Diagnosis Date   Herpes simplex type 1 infection    Social History   Socioeconomic History   Marital status: Unknown    Spouse name: Not on file   Number of children: 2   Years of education: Not on file    Highest education level: Not on file  Occupational History   Occupation: Engineer, maintenance, IT person  Tobacco Use   Smoking status: Never    Passive exposure: Never   Smokeless tobacco: Never  Vaping Use   Vaping Use: Never used  Substance and Sexual Activity   Alcohol use: Yes    Comment: occasional wine   Drug use: Never   Sexual activity: Not Currently  Other Topics Concern   Not on file  Social History Narrative   Not on file   Social Determinants of Health   Financial Resource Strain: Not on file  Food Insecurity: Not on file  Transportation Needs: Not on file  Physical Activity: Not on file  Stress: Not on file  Social Connections: Not on file  Intimate Partner Violence: Not on file   History reviewed. No pertinent family history. Past Surgical History:  Procedure Laterality Date   ABDOMINAL HYSTERECTOMY  2000     Vanessa Kick, MD 08/05/22 1005

## 2024-05-24 ENCOUNTER — Other Ambulatory Visit: Payer: Self-pay | Admitting: Family Medicine

## 2024-05-24 DIAGNOSIS — R748 Abnormal levels of other serum enzymes: Secondary | ICD-10-CM

## 2024-06-06 ENCOUNTER — Inpatient Hospital Stay: Admission: RE | Admit: 2024-06-06 | Discharge: 2024-06-06 | Attending: Family Medicine | Admitting: Family Medicine

## 2024-06-06 DIAGNOSIS — R748 Abnormal levels of other serum enzymes: Secondary | ICD-10-CM

## 2024-06-12 ENCOUNTER — Ambulatory Visit: Admitting: Gastroenterology

## 2024-06-12 ENCOUNTER — Encounter: Payer: Self-pay | Admitting: Gastroenterology

## 2024-06-12 VITALS — BP 142/84 | HR 89 | Ht 62.0 in | Wt 149.4 lb

## 2024-06-12 DIAGNOSIS — R140 Abdominal distension (gaseous): Secondary | ICD-10-CM

## 2024-06-12 DIAGNOSIS — K8020 Calculus of gallbladder without cholecystitis without obstruction: Secondary | ICD-10-CM

## 2024-06-12 DIAGNOSIS — R7401 Elevation of levels of liver transaminase levels: Secondary | ICD-10-CM

## 2024-06-12 DIAGNOSIS — R196 Halitosis: Secondary | ICD-10-CM

## 2024-06-12 NOTE — Progress Notes (Signed)
 Chief Complaint: Halitosis   Referring Provider:     [facility]    HPI:     Kellie Murrill is a 63 y.o. female with a history of hysterectomy referred to the Gastroenterology Clinic for evaluation of halitosis.  He has noticed bad breath for the last 18 months or so.  No improvement with mouthwash, breath mints, etc.  Tried OTC Pepcid without improvement. Has been to the dentist and PCP. Regular, good dental hygiene. No preceding surgery, Abx, change in meds, etc.   Was referred to Dr. Dianna and saw him 1 time and was told there was nothing to workup, so she requested a change in GI.  Otherwise denies classic reflux symptoms to include heartburn, regurgitation.  No dysphagia.  No prior EGD.  Does have intermittent abdominal bloating.  Longstanding history of constipation, but typically controlled with dietary modification such as prunes.  Does not require laxatives, stool softeners, etc.  Received records from her PCP office after the appointment which were notable for the following: - 03/10/2024: AST/ALT 53/10, ALP 92, T. bili 0.5.  Normal CBC including WBC 6.4, H/H 12.5/39.7, MCV 85, PLT 292 - 05/09/2024: AST/ALT 57/11, ALP 85, T. bili 0.4.  Remainder of CMP normal.  Negative/normal hepatitis panel  -Had negaive H pylori stool test per patient, but that was not included in provided records  - 06/06/2024: RUQ US : Diffusely shadowing gallbladder may be due to extensive cholelithiasis or calcified gallbladder wall.  Further evaluation with CT recommended.  No duct dilation.  Normal-appearing liver.  Additionally, I personally reviewed most recent available labs from the [place] from 08/2023: - Normal BMP - H/H 13/40.7, PLT 287 - Protein 8.7, albumin 5, T. bili 0.7, ALP 111, AST/ALT 54/9 - A1c 5.6  Endoscopic History: - Normal colonoscopy at [facility] approximately 10 years ago - 07/2020: Cologuard negative - 2024: Cologuard negative at [facility] per  patient  No known family history of CRC, GI malignancy, liver disease, pancreatic disease, or IBD.     Past Medical History:  Diagnosis Date   Herpes simplex type 1 infection      Past Surgical History:  Procedure Laterality Date   ABDOMINAL HYSTERECTOMY  2000   History reviewed. No pertinent family history. Social History[1] Current Outpatient Medications  Medication Sig Dispense Refill   chlorhexidine (PERIDEX) 0.12 % solution SMARTSIG:15 Milliliter(s) Twice Daily     semaglutide-weight management (WEGOVY) 2.4 MG/0.75ML SOAJ SQ injection Inject 2.4 mg into the skin.     SUPREP BOWEL PREP  KIT 17.5-3.13-1.6 GM/177ML SOLN Take 1 kit by mouth as directed. For colonoscopy prep BIN: [id number] PCN: CN GROUP: [id number] ID: [id number] 354 mL 0   vitamin B-12 (CYANOCOBALAMIN) 1000 MCG tablet Take 1,000 mcg by mouth daily.     No current facility-administered medications for this visit.   Allergies[2]   Review of Systems: All systems reviewed and negative except where noted in HPI.     Physical Exam:    Wt Readings from Last 3 Encounters:  06/12/24 149 lb 6 oz (67.8 kg)  05/15/20 187 lb (84.8 kg)    BP (!) 142/84   Pulse 89   Ht 5' 2 (1.575 m)   Wt 149 lb 6 oz (67.8 kg)   BMI 27.32 kg/m  Constitutional:  Pleasant, in no acute distress. Psychiatric: Normal mood and affect. Behavior is normal. EENT: Pupils normal.  Conjunctivae are normal. No scleral icterus. Neck supple.  No cervical LAD. Cardiovascular: Normal rate, regular rhythm. No edema Pulmonary/chest: Effort normal and breath sounds normal. No wheezing, rales or rhonchi. Abdominal: Soft, nondistended, nontender. Bowel sounds active throughout. There are no masses palpable. No hepatomegaly. Neurological: Alert and oriented to person place and time. Skin: Skin is warm and dry. No rashes noted.   ASSESSMENT AND PLAN;   1) Halitosis 2) Abdominal bloating Discussed potential GI etiologies for halitosis.  DDx  includes reflux, although does not have typical reflux symptoms such as heartburn or regurgitation.  Does have intermittent bloating, so possibly an element of SIBO?  Discussed role/utility of endoscopic evaluation, but she would prefer less invasive methods first. - SIBO breath test to ensure no overlapping overgrowth - If H. pylori test was not done, this would be worth checking stool antigen as H. pylori can be associated with halitosis - If above unremarkable, plan for EGD to evaluate for erosive esophagitis or other reflux features vs trial of high-dose PPI therapy for diagnostic and therapeutic intent  3) Cholelithiasis 4) Elevated AST Recent ultrasound with diffuse shadowing of the gallbladder which could be from extensive cholelithiasis or calcified gallbladder wall.  Plan for cross-sectional imaging for further evaluation with surgical referral as appropriate. - CT abdomen/pelvis ordered  5) Colon cancer screening - Per patient, had negative Cologuard last year    Sandor LULLA Flatter, DO, FACG  06/12/2024, 9:03 AM   Fulp, Cammie, MD     [1]  Social History Tobacco Use   Smoking status: Never    Passive exposure: Never   Smokeless tobacco: Never  Vaping Use   Vaping status: Never Used  Substance Use Topics   Alcohol use: Yes    Comment: occasional wine   Drug use: Never  [2]  Allergies Allergen Reactions   Reglan [Metoclopramide] Other (See Comments) and Nausea And Vomiting    Stroke like symptoms Stroke like symptoms

## 2024-06-12 NOTE — Patient Instructions (Signed)
 _______________________________________________________  If your blood pressure at your visit was 140/90 or greater, please contact your primary care physician to follow up on this.  _______________________________________________________  If you are age 62 or older, your body mass index should be between 23-30. Your Body mass index is 27.32 kg/m. If this is out of the aforementioned range listed, please consider follow up with your Primary Care Provider.  If you are age 45 or younger, your body mass index should be between 19-25. Your Body mass index is 27.32 kg/m. If this is out of the aformentioned range listed, please consider follow up with your Primary Care Provider.   ________________________________________________________  The [facility] providers would like to encourage you to use MYCHART to communicate with providers for non-urgent requests or questions.  Due to long hold times on the telephone, sending your provider a message by [facility] may be a faster and more efficient way to get a response.  Please allow 48 business hours for a response.  Please remember that this is for non-urgent requests.  _______________________________________________________  [facility] is using a team-based approach to care.  Your team is made up of your doctor and two to three APPS. Our APPS (Nurse Practitioners and Physician Assistants) work with your physician to ensure care continuity for you. They are fully qualified to address your health concerns and develop a treatment plan. They communicate directly with your gastroenterologist to care for you. Seeing the Advanced Practice Practitioners on your physician's team can help you by facilitating care more promptly, often allowing for earlier appointments, access to diagnostic testing, procedures, and other specialty referrals.   You have been scheduled for a CT scan of the abdomen and pelvis at [facility], 1st floor Radiology. You  are scheduled on 06-28-24 at 3PM. You should arrive 15 minutes prior to your appointment time for registration.    If you have any questions regarding your exam or if you need to reschedule, you may call [facility] at [phone] between the hours of 8:00 am and 5:00 pm, Monday-Friday.   Due to recent changes in healthcare laws, you may see the results of your imaging and laboratory studies on MyChart before your provider has had a chance to review them.  We understand that in some cases there may be results that are confusing or concerning to you. Not all laboratory results come back in the same time frame and the provider may be waiting for multiple results in order to interpret others.  Please give us  48 hours in order for your provider to thoroughly review all the results before contacting the office for clarification of your results.   You have been given a testing kit to check for small intestine bacterial overgrowth (SIBO) which is completed by a company named [employer]. Make sure to return your test in the mail using the return mailing label given to you along with the kit. The test order, your demographic and insurance information have all already been sent to the company. [facility] will collect an upfront charge of $109.00 for commercial insurance plans and $229.00 if you are paying cash. The potential remaining total after claim submission and review is $120.00. Make sure to discuss with [facility] PRIOR to having the test to see if they have gotten information from your insurance company as to how much your testing will cost out of pocket, if any. Please contact [facility] at phone number [phone] to get instructions regarding how to perform the test as our  office is unable to give specific testing instructions.   It was a pleasure to see you today!  Vito Cirigliano, D.O.

## 2024-06-16 ENCOUNTER — Telehealth: Payer: Self-pay | Admitting: Gastroenterology

## 2024-06-16 DIAGNOSIS — R196 Halitosis: Secondary | ICD-10-CM

## 2024-06-16 DIAGNOSIS — R140 Abdominal distension (gaseous): Secondary | ICD-10-CM

## 2024-06-16 NOTE — Telephone Encounter (Signed)
 Pt following up on ICD Pen code. Pt stated insurance informed her that the arrow diagnosis kit is out of network. Insurance requesting [facility] or [facility] breath kit. Please advise thank you.

## 2024-06-16 NOTE — Telephone Encounter (Signed)
 Spoke w pt about Breath Kit. Pt stated that she has called insurance and her insurance is asking for an ICD Pen Code. Pt requesting a call back. Please advise thank you.

## 2024-06-16 NOTE — Telephone Encounter (Signed)
 Returned called to the patient who needed diagnosis code for the reason she should complete a SIBO breath test.  Information was provided to the patient and she was advised should she need any further assistance to contact our office.  Patient agreed to plan and verbalized understanding.

## 2024-06-16 NOTE — Telephone Encounter (Signed)
 I am not sure if there is a SIBO breath test with [facility] or [facility].  I have asked other employees here and they are not aware of it.  Patient stated she does not have the funds to pay out of pocket for this test and would like to hold off on completing at this time.

## 2024-06-19 ENCOUNTER — Telehealth: Payer: Self-pay | Admitting: Gastroenterology

## 2024-06-19 NOTE — Telephone Encounter (Signed)
 Inbound call from the she no longer needs labs to see if she is needing to have a SIBO test done. Patient advised me that she is already in the processing of doing the SIBO test and will mail it in tomorrow. Please advise.

## 2024-06-19 NOTE — Telephone Encounter (Signed)
 Attempted to reach patient.LVM  Patient returned call.  She is sending off SIBO test and advised she would also be proceeding with her  scheduled CT scan.

## 2024-06-28 ENCOUNTER — Ambulatory Visit (HOSPITAL_COMMUNITY)

## 2024-06-30 ENCOUNTER — Encounter (HOSPITAL_COMMUNITY): Payer: Self-pay

## 2024-06-30 ENCOUNTER — Ambulatory Visit (HOSPITAL_COMMUNITY)
Admission: RE | Admit: 2024-06-30 | Discharge: 2024-06-30 | Disposition: A | Discharge status: Home / Self Care | Source: Ambulatory Visit | Attending: Gastroenterology | Admitting: Gastroenterology

## 2024-06-30 DIAGNOSIS — R196 Halitosis: Secondary | ICD-10-CM | POA: Diagnosis present

## 2024-06-30 DIAGNOSIS — K8020 Calculus of gallbladder without cholecystitis without obstruction: Secondary | ICD-10-CM | POA: Diagnosis present

## 2024-06-30 MED ORDER — IOHEXOL 300 MG/ML  SOLN
100.0000 mL | Freq: Once | INTRAMUSCULAR | Status: AC | PRN
  Administered 2024-06-30: 100 mL via INTRAVENOUS

## 2024-07-05 ENCOUNTER — Ambulatory Visit: Payer: Self-pay | Admitting: Gastroenterology

## 2024-07-05 ENCOUNTER — Telehealth: Payer: Self-pay | Admitting: Gastroenterology

## 2024-07-05 NOTE — Telephone Encounter (Signed)
 Patient called requesting to know if lab results were received. Please advise, thank you

## 2024-07-05 NOTE — Telephone Encounter (Signed)
 Spoke with patient.  Reviewed CT results and made appointment with Dr Kemp 07/06/24 to review results. Searched for SIBO results in [employer] website, no results available. Patient voiced understanding and confirmed appointment for tomorrow.

## 2024-07-06 ENCOUNTER — Encounter: Payer: Self-pay | Admitting: Gastroenterology

## 2024-07-06 ENCOUNTER — Ambulatory Visit (INDEPENDENT_AMBULATORY_CARE_PROVIDER_SITE_OTHER): Admitting: Gastroenterology

## 2024-07-06 ENCOUNTER — Other Ambulatory Visit: Payer: Self-pay

## 2024-07-06 VITALS — BP 132/78 | HR 58 | Ht 62.0 in | Wt 155.0 lb

## 2024-07-06 DIAGNOSIS — K8689 Other specified diseases of pancreas: Secondary | ICD-10-CM

## 2024-07-06 DIAGNOSIS — R7401 Elevation of levels of liver transaminase levels: Secondary | ICD-10-CM | POA: Diagnosis not present

## 2024-07-06 DIAGNOSIS — Q453 Other congenital malformations of pancreas and pancreatic duct: Secondary | ICD-10-CM

## 2024-07-06 DIAGNOSIS — K5900 Constipation, unspecified: Secondary | ICD-10-CM | POA: Diagnosis not present

## 2024-07-06 DIAGNOSIS — R9389 Abnormal findings on diagnostic imaging of other specified body structures: Secondary | ICD-10-CM

## 2024-07-06 MED ORDER — LINACLOTIDE 72 MCG PO CAPS: 72.0000 ug | ORAL_CAPSULE | Freq: Every day | ORAL | Status: DC

## 2024-07-06 NOTE — Progress Notes (Signed)
 "  Chief Complaint:    Review CT results  GI History: 64 year old female with a history of hysterectomy, initially seen in GI clinic on 06/12/2024 for evaluation of halitosis.  Symptoms started 18 months prior or so.  Previously seen by Dr. Dianna but then requested transfer of care.  No improvement with mouthwash, breath mints, etc.  Tried OTC Pepcid without improvement. Has been to the dentist and PCP. Regular, good dental hygiene. No preceding surgery, Abx, change in meds, etc.   Otherwise denies classic reflux symptoms to include heartburn, regurgitation.  No dysphagia.  No prior EGD.   Does have intermittent abdominal bloating.  Longstanding history of constipation, but typically controlled with dietary modification such as prunes.  Does not require laxatives, stool softeners, etc.  - 06/06/2024: RUQ US : Diffusely shadowing gallbladder may be due to extensive cholelithiasis or calcified gallbladder wall. Further evaluation with CT recommended. No duct dilation. Normal-appearing liver.   Endoscopic History: - Normal colonoscopy at [facility] approximately 10 years ago - 07/2020: Cologuard negative - 2024: Cologuard negative at [facility] per patient  HPI:     Patient is a 64 y.o. female presenting to the Gastroenterology Clinic today to review results of her recent CT as outlined below.  Otherwise no changes in clinical history.  No acute issues today.  -06/30/2024: CT A/P: Cholelithiasis without cholecystitis, no CBD dilation. Focal prominence of the pancreatic duct in the head of the pancreas measuring 5 mm without acute inflammation. A few subcentimeter hepatic hypodensities scattered in the liver which are favored to be benign findings. Increased colonic stool burden consistent with constipation, diverticulosis without diverticulitis.  MRI/MRCP ordered and scheduled for next week.  Otherwise asymptomatic from a pancreaticobiliary standpoint.  Separately, has hx of 1 BM/week. Has been like  that as long as she can remember.  Perhaps more bothersome recently and will use prunes prn. No hematochezia, melena. Has tried Miralax without benefit (increased gas if anything).   Review of systems:     No chest pain, no SOB, no fevers, no urinary sx   Past Medical History:  Diagnosis Date   Herpes simplex type 1 infection     Patient's surgical history, family medical history, social history, medications and allergies were all reviewed in Epic    Current Outpatient Medications  Medication Sig Dispense Refill   chlorhexidine (PERIDEX) 0.12 % solution SMARTSIG:15 Milliliter(s) Twice Daily     semaglutide-weight management (WEGOVY) 2.4 MG/0.75ML SOAJ SQ injection Inject 2.4 mg into the skin.     No current facility-administered medications for this visit.    Physical Exam:     BP 132/78   Pulse (!) 58   Ht 5' 2 (1.575 m)   Wt 155 lb (70.3 kg)   SpO2 99%   BMI 28.35 kg/m   GENERAL:  Pleasant female in NAD PSYCH: : Cooperative, normal affect Musculoskeletal:  Normal muscle tone, normal strength NEURO: Alert and oriented x 3, no focal neurologic deficits   IMPRESSION and PLAN:    1) Dilated Pancreatic duct - MRI/MRCP ordered and scheduled for next week - Depending on MRI/MRCP findings, will discuss role/utility of EUS - Will order additional labs as appropriate depending on MRI/MRCP findings (i.e. IgG4, CA 19-9, etc.)   2) Constipation Longstanding history of constipation.  Recent CT with retained stool. - Linzess  72 mcg daily to start.  Counseled on diarrhea, abdominal cramping, etc. in the first 7-14 days of starting.  Asked her to message me about 2-4 weeks after starting.  If suboptimal response, can increase dose.  If response too robust, can titrate to every other day dosing - Increase daily hydration with goal of 64 ounces of water daily  3) Elevated AST Multiple labs notable for mildly elevated AST in the 50s with otherwise normal ALT, ALP, T. bili.  Otherwise  normal remainder of the CMP and CBC on multiple studies.  Has been like that since at least 03/2017.  RUQ US  in 05/2024 with otherwise normal-appearing liver.  CT last week without any obvious hepatic pathology. - Can follow-up on MRI/MRCP results as above  I spent 35 minutes of time, including in depth chart review, independent review of results as outlined above, communicating results with the patient directly, face-to-face time with the patient, coordinating care, and ordering studies and medications as appropriate, and documentation.       Sandor LULLA Flatter ,DO, FACG 07/06/2024, 2:52 PM  "

## 2024-07-06 NOTE — Patient Instructions (Addendum)
 _______________________________________________________  If your blood pressure at your visit was 140/90 or greater, please contact your primary care physician to follow up on this.  _______________________________________________________  If you are age 63 or older, your body mass index should be between 23-30. Your Body mass index is 28.35 kg/m. If this is out of the aforementioned range listed, please consider follow up with your Primary Care Provider.  If you are age 27 or younger, your body mass index should be between 19-25. Your Body mass index is 28.35 kg/m. If this is out of the aformentioned range listed, please consider follow up with your Primary Care Provider.   ________________________________________________________  The [facility] providers would like to encourage you to use MYCHART to communicate with providers for non-urgent requests or questions.  Due to long hold times on the telephone, sending your provider a message by [facility] may be a faster and more efficient way to get a response.  Please allow 48 business hours for a response.  Please remember that this is for non-urgent requests.  _______________________________________________________  [facility] is using a team-based approach to care.  Your team is made up of your doctor and two to three APPS. Our APPS (Nurse Practitioners and Physician Assistants) work with your physician to ensure care continuity for you. They are fully qualified to address your health concerns and develop a treatment plan. They communicate directly with your gastroenterologist to care for you. Seeing the Advanced Practice Practitioners on your physician's team can help you by facilitating care more promptly, often allowing for earlier appointments, access to diagnostic testing, procedures, and other specialty referrals.   We have sent the following medications to your pharmacy for you to pick up at your convenience:  START: Linzess   72 mcg once daily  Linzess  works best when taken once a day every day, on an empty stomach, at least 30 minutes before your first meal of the day.  When Linzess  is taken daily as directed:  *Constipation relief is typically felt in about a week *IBS-C patients may begin to experience relief from belly pain and overall abdominal symptoms (pain, discomfort, and bloating) in about 1 week,   with symptoms typically improving over 12 weeks.  Diarrhea may occur in the first 2 weeks -keep taking it.  The diarrhea should go away and you should start having normal, complete, full bowel movements. It may be helpful to start treatment when you can be near the comfort of your own bathroom, such as a weekend.   You have been scheduled for an MRI/MRCP at [facility] on 07-12-24. Your appointment time is 1pm. Please arrive to admitting (at main entrance of the hospital) 30 minutes prior to your appointment time for registration purposes. Please make certain not to have anything to eat or drink 4 hours prior to your test. In addition, if you have any metal in your body, have a pacemaker or defibrillator, please be sure to let your ordering physician know. This test typically takes 45 minutes to 1 hour to complete. Should you need to reschedule, please call [phone] to do so.  Due to recent changes in healthcare laws, you may see the results of your imaging and laboratory studies on MyChart before your provider has had a chance to review them.  We understand that in some cases there may be results that are confusing or concerning to you. Not all laboratory results come back in the same time frame and the provider may be waiting for multiple  results in order to interpret others.  Please give us  48 hours in order for your provider to thoroughly review all the results before contacting the office for clarification of your results.   It was a pleasure to see you today!  Vito Cirigliano, D.O.

## 2024-07-12 ENCOUNTER — Other Ambulatory Visit: Payer: Self-pay | Admitting: Gastroenterology

## 2024-07-12 ENCOUNTER — Ambulatory Visit (HOSPITAL_COMMUNITY)
Admission: RE | Admit: 2024-07-12 | Discharge: 2024-07-12 | Disposition: A | Discharge status: Home / Self Care | Source: Ambulatory Visit | Attending: Gastroenterology | Admitting: Gastroenterology

## 2024-07-12 DIAGNOSIS — Q453 Other congenital malformations of pancreas and pancreatic duct: Secondary | ICD-10-CM

## 2024-07-12 DIAGNOSIS — R9389 Abnormal findings on diagnostic imaging of other specified body structures: Secondary | ICD-10-CM | POA: Diagnosis present

## 2024-07-12 MED ORDER — GADOBUTROL 1 MMOL/ML IV SOLN
7.0000 mL | Freq: Once | INTRAVENOUS | Status: AC | PRN
  Administered 2024-07-12: 7 mL via INTRAVENOUS

## 2024-07-13 ENCOUNTER — Ambulatory Visit: Payer: Self-pay | Admitting: Gastroenterology

## 2024-07-13 ENCOUNTER — Telehealth: Payer: Self-pay

## 2024-07-13 DIAGNOSIS — K8689 Other specified diseases of pancreas: Secondary | ICD-10-CM

## 2024-07-13 MED ORDER — LINACLOTIDE 72 MCG PO CAPS: 72.0000 ug | ORAL_CAPSULE | Freq: Every day | ORAL | 11 refills | Status: DC

## 2024-07-13 NOTE — Telephone Encounter (Signed)
 Phone call was made to patient in regards to results of MRI/MRCP.  While on the phone with the patient she had complaints of bloating since starting Linzess .  Patient stated she is having daily bowel movements since starting Linzess .  Patient advised to continue using Linzess  72mcg daily and she could take 2 capsules to facilitate her bowels to completely empty and assist with bloating.  Patient instructed to contact our office in 1 week or before if symptoms worsen.  Patient request Linzess  72 mcg be sent to her local pharmacy.  Patient agreed to plan and verbalized understanding.

## 2024-07-14 ENCOUNTER — Other Ambulatory Visit (INDEPENDENT_AMBULATORY_CARE_PROVIDER_SITE_OTHER)

## 2024-07-14 DIAGNOSIS — K8689 Other specified diseases of pancreas: Secondary | ICD-10-CM | POA: Diagnosis not present

## 2024-07-16 LAB — IGG 4: IgG, Subclass 4: 119 mg/dL — ABNORMAL HIGH (ref 2–96)

## 2024-07-17 LAB — CANCER ANTIGEN 19-9: CA 19-9: 43 U/mL — ABNORMAL HIGH

## 2024-07-17 MED ORDER — LINACLOTIDE 145 MCG PO CAPS: 145.0000 ug | ORAL_CAPSULE | Freq: Every day | ORAL | 3 refills | Status: AC

## 2024-07-17 NOTE — Telephone Encounter (Signed)
 Patient advised Linzess  145 mcg daily was sent to [facility] pharmacy.  Patient agreed to plan and verbalized understanding.

## 2024-07-17 NOTE — Addendum Note (Signed)
 Addended by: BENJAMINE NAT DEL on: 07/17/2024 01:52 PM   Modules accepted: Orders

## 2024-07-17 NOTE — Telephone Encounter (Signed)
 Patient is requesting for higher dosage of linzess  to be sent into pharmacy. Please advise, thank you

## 2024-07-17 NOTE — Telephone Encounter (Signed)
 Patient called last week with complaints of bloating and did not feel she was completely emptying her bowels after recently starting Linzess .  Patient was advised to increase to Linzess  .  She returned call this morning and stated she feels Linzess  145 mcg has helped with bloating and she feels that she has had complete bowel movements. Is it OK to send in Rx for Linzess  145mcg daily?  Thank you

## 2024-07-25 ENCOUNTER — Telehealth: Payer: Self-pay | Admitting: Gastroenterology

## 2024-07-25 NOTE — Telephone Encounter (Signed)
 PT is calling to discuss when her EUS can be scheduled with Dr. Wilhelmenia. She stated that her daughter is getting married soon and she just wants to organize her life and get a start on all this. Please advise.

## 2024-07-26 ENCOUNTER — Other Ambulatory Visit: Payer: Self-pay

## 2024-07-26 ENCOUNTER — Telehealth: Payer: Self-pay

## 2024-07-26 DIAGNOSIS — Q453 Other congenital malformations of pancreas and pancreatic duct: Secondary | ICD-10-CM

## 2024-07-26 DIAGNOSIS — R9389 Abnormal findings on diagnostic imaging of other specified body structures: Secondary | ICD-10-CM

## 2024-07-26 DIAGNOSIS — K8689 Other specified diseases of pancreas: Secondary | ICD-10-CM

## 2024-07-26 DIAGNOSIS — R7401 Elevation of levels of liver transaminase levels: Secondary | ICD-10-CM

## 2024-07-26 NOTE — Telephone Encounter (Signed)
-----   Message from Aloha Finner, MD sent at 07/20/2024  5:18 PM EST ----- VC, EUS is reasonable next step in her evaluation.  Rody Keadle, Can you see if we can try and fit this patient in for Upper EUS in the next 3-4 weeks? GM ----- Message ----- From: San Sandor GAILS, DO Sent: 07/18/2024  11:48 AM EST To: Aloha Finner Raddle., MD  GM-  Sending this patient your way for EUS.  CT earlier this month with incidentally noted dilated PD in the head of the pancreas at 5 mm.  AST slightly elevated at 52, but has been like that for a couple of years.  Otherwise normal ALP, T. bili and remainder of CMP.  A1c 5.9% and stable.    I did a subsequent MRI/MRCP which does show mild dilation of the PD at 5-6 mm with the remainder of the pancreas otherwise being normal-appearing.  IgG4 and CA 19-9 slightly elevated at 119 and 43, respectively.  Placed a referral for EUS unless you feel differently.  And if unable to accommodate in your schedule, please let me know and I will look to get her set up at one of the academic centers.  Thanks for reviewing!  VC

## 2024-07-26 NOTE — Telephone Encounter (Signed)
 Patient aware that she has been scheduled for upper EUS with Dr Wilhelmenia at [facility] on 09-18-24 at 9:15 am.  Patient advised to arrive at [facility] at 745am for registration.  Patient agreed to plan and verbalized understanding.  No further questions or concerns.

## 2024-07-26 NOTE — Telephone Encounter (Signed)
 Patient has been scheduled for 09/18/24 @ [facility]. All instructions will be mailed to patient.

## 2024-09-18 ENCOUNTER — Ambulatory Visit (HOSPITAL_COMMUNITY): Admit: 2024-09-18 | Admitting: Gastroenterology

## 2024-09-18 ENCOUNTER — Encounter (HOSPITAL_COMMUNITY): Payer: Self-pay
